# Patient Record
Sex: Male | Born: 1995 | Race: White | Hispanic: No | Marital: Single | Smoking: Never smoker
Health system: Southern US, Community
[De-identification: ages and names within clinical notes are randomized; demographics above are authoritative.]

---
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
## 2014-01-20 ENCOUNTER — Ambulatory Visit: Payer: Self-pay | Admitting: Cardiovascular Disease

## 2014-10-20 ENCOUNTER — Emergency Department: Payer: Self-pay | Admitting: Emergency Medicine

## 2014-10-20 LAB — CBC WITH DIFFERENTIAL/PLATELET
BASOS ABS: 0 10*3/uL (ref 0.0–0.1)
BASOS PCT: 0.3 %
EOS ABS: 0.1 10*3/uL (ref 0.0–0.7)
Eosinophil %: 0.9 %
HCT: 43.1 % (ref 40.0–52.0)
HGB: 14.1 g/dL (ref 13.0–18.0)
Lymphocyte #: 0.5 10*3/uL — ABNORMAL LOW (ref 1.0–3.6)
Lymphocyte %: 6.5 %
MCH: 28.8 pg (ref 26.0–34.0)
MCHC: 32.7 g/dL (ref 32.0–36.0)
MCV: 88 fL (ref 80–100)
MONO ABS: 0.5 x10 3/mm (ref 0.2–1.0)
Monocyte %: 6.6 %
NEUTROS PCT: 85.7 %
Neutrophil #: 6.1 10*3/uL (ref 1.4–6.5)
Platelet: 101 10*3/uL — ABNORMAL LOW (ref 150–440)
RBC: 4.9 10*6/uL (ref 4.40–5.90)
RDW: 13.7 % (ref 11.5–14.5)
WBC: 7.1 10*3/uL (ref 3.8–10.6)

## 2014-10-20 LAB — COMPREHENSIVE METABOLIC PANEL
ALT: 36 U/L (ref 14–63)
Albumin: 3.9 g/dL (ref 3.4–5.0)
Alkaline Phosphatase: 61 U/L (ref 46–116)
Anion Gap: 6 — ABNORMAL LOW (ref 7–16)
BUN: 17 mg/dL (ref 7–18)
Bilirubin,Total: 0.5 mg/dL (ref 0.2–1.0)
CHLORIDE: 104 mmol/L (ref 98–107)
CREATININE: 0.96 mg/dL (ref 0.60–1.30)
Calcium, Total: 8.4 mg/dL — ABNORMAL LOW (ref 8.5–10.1)
Co2: 29 mmol/L (ref 21–32)
EGFR (African American): >60
EGFR (Non-African Amer.): >60
Glucose: 100 mg/dL — ABNORMAL HIGH (ref 65–99)
OSMOLALITY: 279 (ref 275–301)
POTASSIUM: 3.9 mmol/L (ref 3.5–5.1)
SGOT(AST): 33 U/L (ref 15–37)
SODIUM: 139 mmol/L (ref 136–145)
TOTAL PROTEIN: 6.9 g/dL (ref 6.4–8.2)

## 2014-10-20 LAB — ED INFLUENZA
H1N1 flu by pcr: NOT DETECTED
INFLAPCR: NEGATIVE
INFLBPCR: NEGATIVE

## 2014-10-20 LAB — MONONUCLEOSIS SCREEN: Mono Test: NEGATIVE

## 2016-09-23 ENCOUNTER — Encounter: Payer: Self-pay | Admitting: Family Medicine

## 2016-09-23 ENCOUNTER — Encounter (INDEPENDENT_AMBULATORY_CARE_PROVIDER_SITE_OTHER): Payer: Self-pay

## 2016-09-23 ENCOUNTER — Ambulatory Visit (INDEPENDENT_AMBULATORY_CARE_PROVIDER_SITE_OTHER): Payer: 59 | Admitting: Family Medicine

## 2016-09-23 DIAGNOSIS — Z0000 Encounter for general adult medical examination without abnormal findings: Secondary | ICD-10-CM | POA: Insufficient documentation

## 2016-09-23 NOTE — Assessment & Plan Note (Signed)
Healthy young man. Preventative health care up to date. Declines labs today. Little alcohol. No tobacco.  Continue healthy diet and exercise.

## 2016-09-23 NOTE — Progress Notes (Signed)
Pre visit review using our clinic review tool, if applicable. No additional management support is needed unless otherwise documented below in the visit note. 

## 2016-09-23 NOTE — Patient Instructions (Signed)
Follow up annually.  See me sooner for acute issues.  Take care  Dr. Adriana Simasook   Health Maintenance, Male A healthy lifestyle and preventative care can promote health and wellness.  Maintain regular health, dental, and eye exams.  Eat a healthy diet. Foods like vegetables, fruits, whole grains, low-fat dairy products, and lean protein foods contain the nutrients you need and are low in calories. Decrease your intake of foods high in solid fats, added sugars, and salt. Get information about a proper diet from your health care provider, if necessary.  Regular physical exercise is one of the most important things you can do for your health. Most adults should get at least 150 minutes of moderate-intensity exercise (any activity that increases your heart rate and causes you to sweat) each week. In addition, most adults need muscle-strengthening exercises on 2 or more days a week.   Maintain a healthy weight. The body mass index (BMI) is a screening tool to identify possible weight problems. It provides an estimate of body fat based on height and weight. Your health care provider can find your BMI and can help you achieve or maintain a healthy weight. For males 20 years and older:  A BMI below 18.5 is considered underweight.  A BMI of 18.5 to 24.9 is normal.  A BMI of 25 to 29.9 is considered overweight.  A BMI of 30 and above is considered obese.  Maintain normal blood lipids and cholesterol by exercising and minimizing your intake of saturated fat. Eat a balanced diet with plenty of fruits and vegetables. Blood tests for lipids and cholesterol should begin at age 23 and be repeated every 5 years. If your lipid or cholesterol levels are high, you are over age 23, or you are at high risk for heart disease, you may need your cholesterol levels checked more frequently.Ongoing high lipid and cholesterol levels should be treated with medicines if diet and exercise are not working.  If you smoke,  find out from your health care provider how to quit. If you do not use tobacco, do not start.  Lung cancer screening is recommended for adults aged 55-80 years who are at high risk for developing lung cancer because of a history of smoking. A yearly low-dose CT scan of the lungs is recommended for people who have at least a 30-pack-year history of smoking and are current smokers or have quit within the past 15 years. A pack year of smoking is smoking an average of 1 pack of cigarettes a day for 1 year (for example, a 30-pack-year history of smoking could mean smoking 1 pack a day for 30 years or 2 packs a day for 15 years). Yearly screening should continue until the smoker has stopped smoking for at least 15 years. Yearly screening should be stopped for people who develop a health problem that would prevent them from having lung cancer treatment.  If you choose to drink alcohol, do not have more than 2 drinks per day. One drink is considered to be 12 oz (360 mL) of beer, 5 oz (150 mL) of wine, or 1.5 oz (45 mL) of liquor.  Avoid the use of street drugs. Do not share needles with anyone. Ask for help if you need support or instructions about stopping the use of drugs.  High blood pressure causes heart disease and increases the risk of stroke. High blood pressure is more likely to develop in:  People who have blood pressure in the end of the normal  range (100-139/85-89 mm Hg).  People who are overweight or obese.  People who are African American.  If you are 3718-23 years of age, have your blood pressure checked every 3-5 years. If you are 23 years of age or older, have your blood pressure checked every year. You should have your blood pressure measured twice-once when you are at a hospital or clinic, and once when you are not at a hospital or clinic. Record the average of the two measurements. To check your blood pressure when you are not at a hospital or clinic, you can use:  An automated blood  pressure machine at a pharmacy.  A home blood pressure monitor.  If you are 6545-23 years old, ask your health care provider if you should take aspirin to prevent heart disease.  Diabetes screening involves taking a blood sample to check your fasting blood sugar level. This should be done once every 3 years after age 23 if you are at a normal weight and without risk factors for diabetes. Testing should be considered at a younger age or be carried out more frequently if you are overweight and have at least 1 risk factor for diabetes.  Colorectal cancer can be detected and often prevented. Most routine colorectal cancer screening begins at the age of 23 and continues through age 23. However, your health care provider may recommend screening at an earlier age if you have risk factors for colon cancer. On a yearly basis, your health care provider may provide home test kits to check for hidden blood in the stool. A small camera at the end of a tube may be used to directly examine the colon (sigmoidoscopy or colonoscopy) to detect the earliest forms of colorectal cancer. Talk to your health care provider about this at age 23 when routine screening begins. A direct exam of the colon should be repeated every 5-10 years through age 23, unless early forms of precancerous polyps or small growths are found.  People who are at an increased risk for hepatitis B should be screened for this virus. You are considered at high risk for hepatitis B if:  You were born in a country where hepatitis B occurs often. Talk with your health care provider about which countries are considered high risk.  Your parents were born in a high-risk country and you have not received a shot to protect against hepatitis B (hepatitis B vaccine).  You have HIV or AIDS.  You use needles to inject street drugs.  You live with, or have sex with, someone who has hepatitis B.  You are a man who has sex with other men (MSM).  You get  hemodialysis treatment.  You take certain medicines for conditions like cancer, organ transplantation, and autoimmune conditions.  Hepatitis C blood testing is recommended for all people born from [birth year] through 1965 and any individual with known risk factors for hepatitis C.  Healthy men should no longer receive prostate-specific antigen (PSA) blood tests as part of routine cancer screening. Talk to your health care provider about prostate cancer screening.  Testicular cancer screening is not recommended for adolescents or adult males who have no symptoms. Screening includes self-exam, a health care provider exam, and other screening tests. Consult with your health care provider about any symptoms you have or any concerns you have about testicular cancer.  Practice safe sex. Use condoms and avoid high-risk sexual practices to reduce the spread of sexually transmitted infections (STIs).  You should be screened for STIs,  including gonorrhea and chlamydia if:  You are sexually active and are younger than 24 years.  You are older than 24 years, and your health care provider tells you that you are at risk for this type of infection.  Your sexual activity has changed since you were last screened, and you are at an increased risk for chlamydia or gonorrhea. Ask your health care provider if you are at risk.  If you are at risk of being infected with HIV, it is recommended that you take a prescription medicine daily to prevent HIV infection. This is called pre-exposure prophylaxis (PrEP). You are considered at risk if:  You are a man who has sex with other men (MSM).  You are a heterosexual man who is sexually active with multiple partners.  You take drugs by injection.  You are sexually active with a partner who has HIV.  Talk with your health care provider about whether you are at high risk of being infected with HIV. If you choose to begin PrEP, you should first be tested for HIV. You should  then be tested every 3 months for as long as you are taking PrEP.  Use sunscreen. Apply sunscreen liberally and repeatedly throughout the day. You should seek shade when your shadow is shorter than you. Protect yourself by wearing long sleeves, pants, a wide-brimmed hat, and sunglasses year round whenever you are outdoors.  Tell your health care provider of new moles or changes in moles, especially if there is a change in shape or color. Also, tell your health care provider if a mole is larger than the size of a pencil eraser.  A one-time screening for abdominal aortic aneurysm (AAA) and surgical repair of large AAAs by ultrasound is recommended for men aged 19-75 years who are current or former smokers.  Stay current with your vaccines (immunizations). This information is not intended to replace advice given to you by your health care provider. Make sure you discuss any questions you have with your health care provider. Document Released: 02/28/2008 Document Revised: 09/22/2014 Document Reviewed: 06/05/2015 Elsevier Interactive Patient Education  2017 [employer].

## 2016-09-23 NOTE — Progress Notes (Signed)
   Subjective:  Patient ID: Preston Nelson, male    DOB: 06/29/1996  Age: 21 y.o. MRN: [id number]  CC: Establish care, physical  HPI Preston Nelson is a 21 y.o. male presents to the clinic today to for a physical exam.  Preventative Healthcare  Immunizations  Tetanus - Up to date.   Flu - Up to date.   Labs: Declines labs today.  Exercise: Exercises regularly.   Alcohol use: See below.  Smoking/tobacco use: Nonsmoker.  STD/HIV testing: Has had HIV screening.  Wears seat belt: Yes.   PMH, Surgical Hx, Family Hx, Social History reviewed and updated as below.  No PMH per patient.   Past Surgical History:  Procedure Laterality Date  . TONSILLECTOMY  2011  . WISDOM TOOTH EXTRACTION  2012   Family History  Problem Relation Age of Onset  . Prostate cancer Maternal Uncle   . Mental illness Paternal Uncle     bipolar  . Kidney disease Maternal Grandfather   . Alcohol abuse Paternal Grandmother    Social History  Substance Use Topics  . Smoking status: Never Smoker  . Smokeless tobacco: Never Used  . Alcohol use 0.0 - 0.6 oz/week   Review of Systems General: Denies unexplained weight loss, fever. Skin: Denies new or changing mole, sore/wound that won't heal. ENT: Trouble hearing, ringing in the ears, sores in the mouth, hoarseness, trouble swallowing. Eyes: Denies trouble seeing/visual disturbance. Heart/CV: Denies chest pain, shortness of breath, edema, palpitations. Lungs/Resp: Denies cough, shortness of breath, hemoptysis. Abd/GI: Denies nausea, vomiting, diarrhea, constipation, abdominal pain, hematochezia, melena. GU: Denies dysuria, incontinence, hematuria, urinary frequency, difficulty starting/keeping stream, penile discharge, sexual difficulty, lump in testicles. MSK: Denies joint pain/swelling, myalgias. Neuro: Denies headaches, weakness, numbness, dizziness, syncope. Psych: Denies sadness, anxiety, stress, memory difficulty. Endocrine: Denies polyuria and  polydipsia.  Objective:   Today's Vitals: BP 110/70   Pulse (!) 48   Temp 97.5 F (36.4 C) (Oral)   Resp 12   Ht 6' 1.5" (1.867 m) Comment: with shoes  Wt 232 lb 9.6 oz (105.5 kg)   SpO2 99%   BMI 30.27 kg/m   Physical Exam  Constitutional: He is oriented to person, place, and time. He appears well-developed and well-nourished. No distress.  HENT:  Head: Normocephalic and atraumatic.  Nose: Nose normal.  Mouth/Throat: Oropharynx is clear and moist. No oropharyngeal exudate.  Normal TM's bilaterally.   Eyes: Conjunctivae are normal. No scleral icterus.  Neck: Neck supple.  Cardiovascular: Regular rhythm.  Bradycardia present.   No murmur heard. Pulmonary/Chest: Effort normal and breath sounds normal. He has no wheezes. He has no rales.  Abdominal: Soft. He exhibits no distension. There is no tenderness. There is no rebound and no guarding.  Musculoskeletal: Normal range of motion. He exhibits no edema.  Lymphadenopathy:    He has no cervical adenopathy.  Neurological: He is alert and oriented to person, place, and time.  Skin: Skin is warm and dry. No rash noted.  Psychiatric: He has a normal mood and affect.  Vitals reviewed.  Assessment & Plan:   Problem List Items Addressed This Visit    Annual physical exam    Healthy young man. Preventative health care up to date. Declines labs today. Little alcohol. No tobacco.  Continue healthy diet and exercise.        Follow-up: Annually   Everlene OtherJayce Mearl Olver DO [facility] [place]

## 2016-12-26 DIAGNOSIS — J019 Acute sinusitis, unspecified: Secondary | ICD-10-CM | POA: Diagnosis not present

## 2016-12-26 DIAGNOSIS — J301 Allergic rhinitis due to pollen: Secondary | ICD-10-CM | POA: Diagnosis not present

## 2016-12-26 DIAGNOSIS — B9689 Other specified bacterial agents as the cause of diseases classified elsewhere: Secondary | ICD-10-CM | POA: Diagnosis not present

## 2017-05-01 ENCOUNTER — Ambulatory Visit (INDEPENDENT_AMBULATORY_CARE_PROVIDER_SITE_OTHER): Payer: 59 | Admitting: Family Medicine

## 2017-05-01 ENCOUNTER — Encounter: Payer: Self-pay | Admitting: Family Medicine

## 2017-05-01 DIAGNOSIS — F988 Other specified behavioral and emotional disorders with onset usually occurring in childhood and adolescence: Secondary | ICD-10-CM

## 2017-05-01 MED ORDER — LISDEXAMFETAMINE DIMESYLATE 30 MG PO CAPS: 30.0000 mg | ORAL_CAPSULE | Freq: Every day | ORAL | 0 refills | Status: DC

## 2017-05-01 NOTE — Patient Instructions (Signed)
Take the medication as prescribed.  Call or send me a message and let me know how its working.  Take care  Dr. Adriana Simas

## 2017-05-04 DIAGNOSIS — F988 Other specified behavioral and emotional disorders with onset usually occurring in childhood and adolescence: Secondary | ICD-10-CM | POA: Insufficient documentation

## 2017-05-04 NOTE — Assessment & Plan Note (Signed)
New problem (to me). Will need records. Starting Vyvanse.

## 2017-05-04 NOTE — Progress Notes (Signed)
   Subjective:  Patient ID: Preston Nelson, male    DOB: [birth year]  Age: 21 y.o. MRN: [id number]  CC: ADD  HPI:  21 year old male presents with the above complaints.  Patient did not inform me of a prior history of attention deficit disorder at his prior visit. He states that he was diagnosed as a young child. He used several medications while in school. He states that he also saw psychologist in [place]. No records available. He previously saw [facility] pediatrics. He states that the only thing that helped his attention deficit was Vyvanse. He states that he is now in school and is struggling with focusing as well as anxiety that accompanies this. This is been worse over the past 1-2 months. He would like to discuss a trial of Vyvanse.  Social Hx   Social History   Social History  . Marital status: Single    Spouse name: N/A  . Number of children: N/A  . Years of education: N/A   Social History Main Topics  . Smoking status: Never Smoker  . Smokeless tobacco: Never Used  . Alcohol use 0.0 - 0.6 oz/week  . Drug use: No  . Sexual activity: Yes    Partners: Female   Other Topics Concern  . None   Social History Narrative  . None    Review of Systems  Constitutional: Negative.   Psychiatric/Behavioral: Positive for decreased concentration. The patient is nervous/anxious.    Objective:  BP 110/68 (BP Location: Left Arm, Patient Position: Sitting, Cuff Size: Normal)   Pulse 61   Temp 98.7 F (37.1 C) (Oral)   Resp 16   Wt 240 lb (108.9 kg)   SpO2 96%   BMI 31.23 kg/m   BP/Weight 05/01/2017 09/23/2016  Systolic BP 110 110  Diastolic BP 68 70  Wt. (Lbs) 240 232.6  BMI 31.23 30.27    Physical Exam  Constitutional: He is oriented to person, place, and time. He appears well-developed. No distress.  Cardiovascular: Normal rate and regular rhythm.   No murmur heard. Pulmonary/Chest: Effort normal. He has no wheezes. He has no rales.  Neurological: He is alert and  oriented to person, place, and time.  Psychiatric: He has a normal mood and affect.  Vitals reviewed.   Lab Results  Component Value Date   WBC 7.1 10/20/2014   HGB 14.1 10/20/2014   HCT 43.1 10/20/2014   PLT 101 (L) 10/20/2014   GLUCOSE 100 (H) 10/20/2014   ALT 36 10/20/2014   AST 33 10/20/2014   NA 139 10/20/2014   K 3.9 10/20/2014   CL 104 10/20/2014   CREATININE 0.96 10/20/2014   BUN 17 10/20/2014   CO2 29 10/20/2014    Assessment & Plan:   Problem List Items Addressed This Visit    ADD (attention deficit disorder)    New problem (to me). Will need records. Starting Vyvanse.         Meds ordered this encounter  Medications  . lisdexamfetamine (VYVANSE) 30 MG capsule    Sig: Take 1 capsule (30 mg total) by mouth daily.    Dispense:  30 capsule    Refill:  0     Follow-up: PRN  Everlene Other DO [facility]

## 2017-05-05 ENCOUNTER — Telehealth: Payer: Self-pay | Admitting: *Deleted

## 2017-05-05 NOTE — Telephone Encounter (Signed)
Pt requested a call to discuss his Rx for Vyvanse . Pt insuranse denied the script and requested that he tried Adderall first  . Pt stated that in the pass adderall did not work for him, due to side effects. Pt stated this medication may need a prior authorization to approve Vyvanse  Pt [phone]

## 2017-05-05 NOTE — Telephone Encounter (Signed)
Patient states he is unable to tolerate Adderall due to mood swings, loss of appetite, unable to sleep.   Was diagnosed when 10 years.

## 2017-05-06 NOTE — Telephone Encounter (Signed)
Prior authorization submitted to [employer].

## 2017-05-06 NOTE — Telephone Encounter (Signed)
Pt called back looking for an update on this. Pt states that he does not want tot take the adderall because he had negative effects previosuly. Please advise, thank you!

## 2017-05-11 NOTE — Telephone Encounter (Signed)
Prior authorization approved for a maximum of 12 fillls 05/08/17-05/07/18

## 2017-06-15 ENCOUNTER — Encounter: Payer: Self-pay | Admitting: Family

## 2017-06-15 ENCOUNTER — Ambulatory Visit (INDEPENDENT_AMBULATORY_CARE_PROVIDER_SITE_OTHER): Payer: 59 | Admitting: Family

## 2017-06-15 VITALS — BP 120/78 | HR 59 | Temp 98.7°F | Ht 73.5 in | Wt 234.2 lb

## 2017-06-15 DIAGNOSIS — R0981 Nasal congestion: Secondary | ICD-10-CM

## 2017-06-15 DIAGNOSIS — F988 Other specified behavioral and emotional disorders with onset usually occurring in childhood and adolescence: Secondary | ICD-10-CM | POA: Diagnosis not present

## 2017-06-15 MED ORDER — LISDEXAMFETAMINE DIMESYLATE 30 MG PO CAPS: 30.0000 mg | ORAL_CAPSULE | Freq: Every day | ORAL | 0 refills | Status: DC

## 2017-06-15 NOTE — Progress Notes (Signed)
Subjective:    Patient ID: Preston Nelson, male    DOB: 07/04/1996, 21 y.o.   MRN: [id number]  CC: Preston Nelson is a 21 y.o. male who presents today for follow up.   HPI: ADHD-  Would like refill of vyvanse. Diagnosed when 21 years old in [place]. Stopped medication at 21 years old. 'didn't think needed it anymore.' Thought a month ago , wanted to try it again when started full time school and work. Trouble multi tasking.  Thinks vyvanse helps with anxiety as feels overwhelmed.   No depression, anxiety.   No cardiac history. No CP, trouble sleeping, excess sweatiness, weight changes.    Runs regularly.   No drug use or smoking. Occasional alcohol.  Complains of nasal congestion x 2 days, worsening.  Ear pressure in right ear, sinus pressure on right side.  Hasnt tried any medications.  No cough, fever.  Thinks has seasonal allergies.         HISTORY:  No past medical history on file. Past Surgical History:  Procedure Laterality Date  . TONSILLECTOMY  2011  . WISDOM TOOTH EXTRACTION  2012   Family History  Problem Relation Age of Onset  . Prostate cancer Maternal Uncle   . Mental illness Paternal Uncle        bipolar  . Kidney disease Maternal Grandfather   . Alcohol abuse Paternal Grandmother     Allergies: Patient has no known allergies. No current outpatient prescriptions on file prior to visit.   No current facility-administered medications on file prior to visit.     Social History  Substance Use Topics  . Smoking status: Never Smoker  . Smokeless tobacco: Never Used  . Alcohol use 0.0 - 0.6 oz/week    Review of Systems  Constitutional: Negative for chills and fever.  HENT: Positive for congestion, ear pain and sinus pressure. Negative for sore throat.   Respiratory: Negative for cough, shortness of breath and wheezing.   Cardiovascular: Negative for chest pain and palpitations.  Gastrointestinal: Negative for nausea and vomiting.    Psychiatric/Behavioral: Negative for decreased concentration.      Objective:    BP 120/78   Pulse (!) 59   Temp 98.7 F (37.1 C) (Oral)   Ht 6' 1.5" (1.867 m)   Wt 234 lb 3.2 oz (106.2 kg)   SpO2 98%   BMI 30.48 kg/m  BP Readings from Last 3 Encounters:  06/15/17 120/78  05/01/17 110/68  09/23/16 110/70   Wt Readings from Last 3 Encounters:  06/15/17 234 lb 3.2 oz (106.2 kg)  05/01/17 240 lb (108.9 kg)  09/23/16 232 lb 9.6 oz (105.5 kg)    Physical Exam  Constitutional: Vital signs are normal. He appears well-developed and well-nourished.  HENT:  Head: Normocephalic and atraumatic.  Right Ear: Hearing, tympanic membrane, external ear and ear canal normal. No drainage, swelling or tenderness. Tympanic membrane is not injected, not erythematous and not bulging. No middle ear effusion. No decreased hearing is noted.  Left Ear: Hearing, tympanic membrane, external ear and ear canal normal. No drainage, swelling or tenderness. Tympanic membrane is not injected, not erythematous and not bulging.  No middle ear effusion. No decreased hearing is noted.  Nose: Nose normal. Right sinus exhibits no maxillary sinus tenderness and no frontal sinus tenderness. Left sinus exhibits no maxillary sinus tenderness and no frontal sinus tenderness.  Mouth/Throat: Uvula is midline, oropharynx is clear and moist and mucous membranes are normal. No oropharyngeal exudate, posterior  oropharyngeal edema, posterior oropharyngeal erythema or tonsillar abscesses.  Eyes: Conjunctivae are normal.  Cardiovascular: Regular rhythm and normal heart sounds.   Pulmonary/Chest: Effort normal and breath sounds normal. No respiratory distress. He has no wheezes. He has no rhonchi. He has no rales.  Lymphadenopathy:       Head (right side): No submental, no submandibular, no tonsillar, no preauricular, no posterior auricular and no occipital adenopathy present.       Head (left side): No submental, no submandibular,  no tonsillar, no preauricular, no posterior auricular and no occipital adenopathy present.    He has no cervical adenopathy.  Neurological: He is alert.  Skin: Skin is warm and dry.  Psychiatric: He has a normal mood and affect. His speech is normal and behavior is normal.  Vitals reviewed.      Assessment & Plan:   Problem List Items Addressed This Visit      Respiratory   Sinus congestion    Working diagnosis of viral URI x 2 days. No acute respiratory distress. Afebrile. Patient and I agreed upon conservative therapy at this time with symptom management, close observation, and delayed antibiotic treatment.         Other   ADD (attention deficit disorder) - Primary    No records since testing down at 21 yo. At this point, agreed to continue treating patient and trust that he had formal testing. New CSC.  I looked up patient on [place] Controlled Substances Reporting System and saw no activity that raised concern of inappropriate use.        Relevant Medications   lisdexamfetamine (VYVANSE) 30 MG capsule       I am having Preston Nelson maintain his lisdexamfetamine.   Meds ordered this encounter  Medications  . DISCONTD: lisdexamfetamine (VYVANSE) 30 MG capsule    Sig: Take 1 capsule (30 mg total) by mouth daily.    Dispense:  30 capsule    Refill:  0    Order Specific Question:   Supervising Provider    Answer:   Duncan Dull L [[id number]]  . DISCONTD: lisdexamfetamine (VYVANSE) 30 MG capsule    Sig: Take 1 capsule (30 mg total) by mouth daily.    Dispense:  30 capsule    Refill:  0    Do not fill until 07/16/2017    Order Specific Question:   Supervising Provider    Answer:   Duncan Dull L [[id number]]  . lisdexamfetamine (VYVANSE) 30 MG capsule    Sig: Take 1 capsule (30 mg total) by mouth daily.    Dispense:  30 capsule    Refill:  0    Do not fill until 08/15/2017    Order Specific Question:   Supervising Provider    Answer:   Sherlene Shams [[id number]]    Return  precautions given.   Risks, benefits, and alternatives of the medications and treatment plan prescribed today were discussed, and patient expressed understanding.   Education regarding symptom management and diagnosis given to patient on AVS.  Continue to follow with Glori Luis, MD for routine health maintenance.   Crist Fat and I agreed with plan.   Rennie Plowman, FNP

## 2017-06-15 NOTE — Patient Instructions (Signed)
Continue vyvanse; let me know if any side affects  Follow up 3 months with Dr Birdie Sons  Suspect viral congestion  Mucinex as discussed  Let me know if not better

## 2017-06-15 NOTE — Progress Notes (Signed)
Pre visit review using our clinic review tool, if applicable. No additional management support is needed unless otherwise documented below in the visit note. 

## 2017-06-16 DIAGNOSIS — F411 Generalized anxiety disorder: Secondary | ICD-10-CM | POA: Diagnosis not present

## 2017-06-16 DIAGNOSIS — R0981 Nasal congestion: Secondary | ICD-10-CM | POA: Insufficient documentation

## 2017-06-16 NOTE — Assessment & Plan Note (Addendum)
Stable on current regimen. No records since testing down at 21 yo. At this point, agreed to continue treating patient and trust that he had formal testing. New CSC.  I looked up patient on [place] Controlled Substances Reporting System and saw no activity that raised concern of inappropriate use.

## 2017-06-16 NOTE — Assessment & Plan Note (Signed)
Working diagnosis of viral URI x 2 days. No acute respiratory distress. Afebrile. Patient and I agreed upon conservative therapy at this time with symptom management, close observation, and delayed antibiotic treatment.

## 2017-06-17 DIAGNOSIS — F411 Generalized anxiety disorder: Secondary | ICD-10-CM | POA: Diagnosis not present

## 2017-06-23 DIAGNOSIS — F411 Generalized anxiety disorder: Secondary | ICD-10-CM | POA: Diagnosis not present

## 2017-07-01 ENCOUNTER — Ambulatory Visit: Payer: 59 | Admitting: Family Medicine

## 2017-07-03 ENCOUNTER — Emergency Department: Payer: 59

## 2017-07-03 ENCOUNTER — Emergency Department
Admission: EM | Admit: 2017-07-03 | Discharge: 2017-07-03 | Disposition: A | Discharge status: Home / Self Care | Payer: 59 | Attending: Student in an Organized Health Care Education/Training Program | Admitting: Student in an Organized Health Care Education/Training Program

## 2017-07-03 ENCOUNTER — Encounter: Payer: Self-pay | Admitting: Emergency Medicine

## 2017-07-03 DIAGNOSIS — Z79899 Other long term (current) drug therapy: Secondary | ICD-10-CM | POA: Diagnosis not present

## 2017-07-03 DIAGNOSIS — Y929 Unspecified place or not applicable: Secondary | ICD-10-CM | POA: Insufficient documentation

## 2017-07-03 DIAGNOSIS — W228XXA Striking against or struck by other objects, initial encounter: Secondary | ICD-10-CM | POA: Insufficient documentation

## 2017-07-03 DIAGNOSIS — Y999 Unspecified external cause status: Secondary | ICD-10-CM | POA: Insufficient documentation

## 2017-07-03 DIAGNOSIS — S61239A Puncture wound without foreign body of unspecified finger without damage to nail, initial encounter: Secondary | ICD-10-CM

## 2017-07-03 DIAGNOSIS — Y9389 Activity, other specified: Secondary | ICD-10-CM | POA: Insufficient documentation

## 2017-07-03 DIAGNOSIS — S61231A Puncture wound without foreign body of left index finger without damage to nail, initial encounter: Secondary | ICD-10-CM | POA: Insufficient documentation

## 2017-07-03 DIAGNOSIS — S6992XA Unspecified injury of left wrist, hand and finger(s), initial encounter: Secondary | ICD-10-CM | POA: Diagnosis not present

## 2017-07-03 MED ORDER — BACITRACIN ZINC 500 UNIT/GM EX OINT
TOPICAL_OINTMENT | CUTANEOUS | Status: AC
  Administered 2017-07-03: 1 via TOPICAL
  Filled 2017-07-03: qty 0.9

## 2017-07-03 MED ORDER — LIDOCAINE HCL (PF) 1 % IJ SOLN
5.0000 mL | Freq: Once | INTRAMUSCULAR | Status: AC
  Administered 2017-07-03: 5 mL
  Filled 2017-07-03: qty 5

## 2017-07-03 MED ORDER — SULFAMETHOXAZOLE-TRIMETHOPRIM 800-160 MG PO TABS: 1.0000 | ORAL_TABLET | Freq: Two times a day (BID) | ORAL | 0 refills | Status: DC

## 2017-07-03 MED ORDER — SULFAMETHOXAZOLE-TRIMETHOPRIM 800-160 MG PO TABS
1.0000 | ORAL_TABLET | Freq: Once | ORAL | Status: AC
  Administered 2017-07-03: 1 via ORAL
  Filled 2017-07-03: qty 1

## 2017-07-03 MED ORDER — BACITRACIN ZINC 500 UNIT/GM EX OINT
TOPICAL_OINTMENT | Freq: Two times a day (BID) | CUTANEOUS | Status: DC
  Administered 2017-07-03: 1 via TOPICAL

## 2017-07-03 MED ORDER — IBUPROFEN 800 MG PO TABS
800.0000 mg | ORAL_TABLET | Freq: Once | ORAL | Status: AC
  Administered 2017-07-03: 800 mg via ORAL
  Filled 2017-07-03: qty 1

## 2017-07-03 MED ORDER — HYDROCODONE-ACETAMINOPHEN 5-325 MG PO TABS: 1.0000 | ORAL_TABLET | Freq: Three times a day (TID) | ORAL | 0 refills | Status: DC | PRN

## 2017-07-03 NOTE — ED Notes (Signed)
Drilled left index finger at school this AM.  Bandaid in place, no active bleeding.

## 2017-07-03 NOTE — ED Triage Notes (Signed)
Pt states was drilling and drilled into his L index finger. Pt presents with wound to distal pad of  L anterior index finger. No bleeding noted at this time. Pt states he thinks he is up to date on tetanus shot.

## 2017-07-03 NOTE — ED Provider Notes (Signed)
[facility] Emergency Department Provider Note ____________________________________________  Time seen: 1147  I have reviewed the triage vital signs and the nursing notes.  HISTORY  Chief Complaint  Finger Injury  HPI Preston Nelson is a 21 y.o. male Presents to the ED for evaluation of an injury to his left next finger. Patient was at work today, when he actively 21-year-old through a piece of metal, into his left index finger fat pad. He was not wearing gloves at the time. He describes some minimal bleeding and pain to the fingertip. He reports his tetanus is up-to-date as of 2016. He presents now with bleeding controlled to the left index finger fat pad with local swelling and ecchymosis.  History reviewed. No pertinent past medical history.  Patient Active Problem List   Diagnosis Date Noted  . Sinus congestion 06/16/2017  . ADD (attention deficit disorder) 05/04/2017  . Annual physical exam 09/23/2016    Past Surgical History:  Procedure Laterality Date  . TONSILLECTOMY  2011  . WISDOM TOOTH EXTRACTION  2012    Prior to Admission medications   Medication Sig Start Date End Date Taking? Authorizing Provider  HYDROcodone-acetaminophen (NORCO) 5-325 MG tablet Take 1 tablet by mouth 3 (three) times daily as needed. 07/03/17   Pebbles Zeiders, Charlesetta IvoryJenise V Bacon, PA-C  lisdexamfetamine (VYVANSE) 30 MG capsule Take 1 capsule (30 mg total) by mouth daily. 06/15/17   Allegra GranaArnett, Margaret G, FNP  sulfamethoxazole-trimethoprim (BACTRIM DS,SEPTRA DS) 800-160 MG tablet Take 1 tablet by mouth 2 (two) times daily. 07/03/17   Samaira Holzworth, Charlesetta IvoryJenise V Bacon, PA-C    Allergies Patient has no known allergies.  Family History  Problem Relation Age of Onset  . Prostate cancer Maternal Uncle   . Mental illness Paternal Uncle        bipolar  . Kidney disease Maternal Grandfather   . Alcohol abuse Paternal Grandmother     Social History Social History  Substance Use Topics  . Smoking  status: Never Smoker  . Smokeless tobacco: Never Used  . Alcohol use 0.0 - 0.6 oz/week    Review of Systems  Constitutional: Negative for fever. Cardiovascular: Negative for chest pain. Respiratory: Negative for shortness of breath. Musculoskeletal: Negative for back pain. Skin: Negative for rash. Left index finger puncture wound as above Neurological: Negative for headaches, focal weakness or numbness. ____________________________________________  PHYSICAL EXAM:  VITAL SIGNS: ED Triage Vitals  Enc Vitals Group     BP 07/03/17 1119 127/70     Pulse Rate 07/03/17 1119 (!) 53     Resp 07/03/17 1119 18     Temp 07/03/17 1119 98.1 F (36.7 C)     Temp Source 07/03/17 1119 Oral     SpO2 07/03/17 1119 100 %     Weight 07/03/17 1120 224 lb (101.6 kg)     Height 07/03/17 1120 6\' 2"  (1.88 m)     Head Circumference --      Peak Flow --      Pain Score 07/03/17 1119 4     Pain Loc --      Pain Edu? --      Excl. in GC? --     Constitutional: Alert and oriented. Well appearing and in no distress. Head: Normocephalic and atraumatic. Cardiovascular: Normal rate, regular rhythm. Normal distal pulses. Respiratory: Normal respiratory effort. No wheezes/rales/rhonchi. Musculoskeletal: normal composite fist of the left hand. Normal flexion and extension of the left index finger DIP. Patient with a single puncture wound to the  middle aspect of the fat pad. There is local ecchymosis and swelling to the distal left index finger fat pad. Nontender with normal range of motion in all extremities.  Neurologic:  Normal sensation. Normal speech and language. No gross focal neurologic deficits are appreciated. Skin:  Skin is warm, dry and intact. No rash noted. ____________________________________________   RADIOLOGY  Left Index Finger  IMPRESSION: Evidence of old injury to the distal aspect of the second distal phalanx. No acute fracture or dislocation. No radiopaque foreign body. No  apparent arthropathy.  I, Jakeel Starliper, Charlesetta Ivory, personally viewed and evaluated these images (plain radiographs) as part of my medical decision making, as well as reviewing the written report by the radiologist. ____________________________________________  PROCEDURES  IBU 800 mg PO Bactrim DS 1 PO  NERVE BLOCK Performed by: Lissa Hoard Consent: Verbal consent obtained. Required items: required blood products, implants, devices, and special equipment available Time out: Immediately prior to procedure a "time out" was called to verify the correct patient, procedure, equipment, support staff and site/side marked as required.  Indication: pain  Nerve block body site: left index finger flexor tendon sheath   Preparation: Patient was prepped and draped in the usual sterile fashion. Needle gauge: 24 G Location technique: anatomical landmarks  Local anesthetic: 1% lido w/o epi  Anesthetic total: 3 ml  Outcome: pain improved Patient tolerance: Patient tolerated the procedure well with no immediate complications. ____________________________________________  INITIAL IMPRESSION / ASSESSMENT AND PLAN / ED COURSE  Patient presents to the ED for evaluation of a left index finger puncture wound. His x-ray is negative for any retained foreign body or bony injury. He is reassured by that finding. He is offer a digital block for his acutely tender pulp trauma. He is discharged with wound care instructions and a prescription for Bactrim and Norco (#10). He will follow-up with his provider or return as discussed. ____________________________________________  FINAL CLINICAL IMPRESSION(S) / ED DIAGNOSES  Final diagnoses:  Puncture wound of finger of left hand, initial encounter      Lissa Hoard, PA-C 07/03/17 1640    Willy Eddy, MD 07/05/17 [id number]

## 2017-07-03 NOTE — Discharge Instructions (Signed)
Your exam and x-ray are consistent with soft tissue injury to the fat pad of the index finger. Take the antibiotic as directed, and the pain medicine as needed. Keep the wound clean, dry, and covered. Wear the finger splint for comfort. Return to the ED for signs of worsening infection.

## 2017-07-07 DIAGNOSIS — F411 Generalized anxiety disorder: Secondary | ICD-10-CM | POA: Diagnosis not present

## 2017-07-14 DIAGNOSIS — F411 Generalized anxiety disorder: Secondary | ICD-10-CM | POA: Diagnosis not present

## 2017-08-13 ENCOUNTER — Ambulatory Visit: Payer: Self-pay | Admitting: *Deleted

## 2017-08-13 DIAGNOSIS — R102 Pelvic and perineal pain: Secondary | ICD-10-CM | POA: Diagnosis not present

## 2017-08-13 DIAGNOSIS — R1033 Periumbilical pain: Secondary | ICD-10-CM | POA: Diagnosis not present

## 2017-08-13 DIAGNOSIS — R300 Dysuria: Secondary | ICD-10-CM | POA: Diagnosis not present

## 2017-08-13 NOTE — Telephone Encounter (Signed)
FYI

## 2017-08-13 NOTE — Telephone Encounter (Signed)
Pt called with complaints of sharp pulling pain in belly button; pt said stopped after about after 5 hours; previously started about a month ago but pt opted not to go to MD; Pt states that it is hard to ignore pain now and that he has not urinated due to fear of increased pain ; pt  prefers to be seen in MD's office; pt advised per nurse triage protocol he should go to ED for evaluation; pt verbalizes understanding and will proceed to ED; [facility] of events.   Reason for Disposition . [1] Unable to urinate (or only a few drops) > 4 hours AND     [2] bladder feels very full (e.g., palpable bladder or strong urge to urinate)  Answer Assessment - Initial Assessment Questions 1. LOCATION: "Where does it hurt?"      At belly button' internal 2. RADIATION: "Does the pain shoot anywhere else?" (e.g., chest, back)     no 3. ONSET: "When did the pain begin?" (Minutes, hours or days ago)      Initially 4 weeks ago but went away; came back today 4. SUDDEN: "Gradual or sudden onset?"     sudden 5. PATTERN "Does the pain come and go, or is it constant?"    - If constant: "Is it getting better, staying the same, or worsening?"      (Note: Constant means the pain never goes away completely; most serious pain is constant and it progresses)     - If intermittent: "How long does it last?" "Do you have pain now?"     (Note: Intermittent means the pain goes away completely between bouts)    Constant but hurts worse when moving, coughing, or stretching 6. SEVERITY: "How bad is the pain?"  (e.g., Scale 1-10; mild, moderate, or severe)    - MILD (1-3): doesn't interfere with normal activities, abdomen soft and not tender to touch     - MODERATE (4-7): interferes with normal activities or awakens from sleep, tender to touch     - SEVERE (8-10): excruciating pain, doubled over, unable to do any normal activities       Rated 4-5 out of 10 7. RECURRENT SYMPTOM: "Have you ever had  this type of abdominal pain before?" If so, ask: "When was the last time?" and "What happened that time?"      1 month ago 8. CAUSE: "What do you think is causing the abdominal pain?"     hernia 9. RELIEVING/AGGRAVATING FACTORS: "What makes it better or worse?" (e.g., movement, antacids, bowel movement)     movement 10. OTHER SYMPTOMS: "Has there been any vomiting, diarrhea, constipation, or urine problems?"       no  Protocols used: ABDOMINAL PAIN - MALE-A-AH

## 2017-08-13 NOTE — Telephone Encounter (Signed)
Please follow up with the patient to ensure he gets evaluated. Thanks.

## 2017-08-13 NOTE — Telephone Encounter (Signed)
Attempted to reach patient, mailbox is full.

## 2017-08-13 NOTE — Telephone Encounter (Signed)
Noted  

## 2017-08-14 NOTE — Telephone Encounter (Signed)
Patient states he is not having as much pain, he went to [facility] walk in and was evaluated. He was given an antibiotics for prostatitis.

## 2017-08-14 NOTE — Telephone Encounter (Signed)
Please follow-up with patient regarding this. Thanks.

## 2017-08-14 NOTE — Telephone Encounter (Signed)
Noted. Thanks.

## 2017-09-28 ENCOUNTER — Ambulatory Visit: Payer: 59 | Admitting: Family Medicine

## 2017-10-19 DIAGNOSIS — J019 Acute sinusitis, unspecified: Secondary | ICD-10-CM | POA: Diagnosis not present

## 2017-11-16 ENCOUNTER — Telehealth: Payer: Self-pay | Admitting: Family Medicine

## 2017-11-16 NOTE — Telephone Encounter (Signed)
Tried calling, ok for pec to speak with patient and ask why he needs referral, depression? Does patient have any thought of harming himself or others? Any thoughts of suicide? Please get more details

## 2017-11-16 NOTE — Telephone Encounter (Signed)
Copied from CRM [phone]#[id number]. Topic: Quick Communication - See Telephone Encounter >> Nov 16, 2017  3:54 PM Cipriano BunkerLambe, Annette S wrote: CRM for notification. See Telephone encounter for:   Patient called about referral to a psychiatrist.    Set appt up for 3/29, but he did not want to wait this long, asking if can get referral now and not sure if needs to see Dr. Birdie SonsSonnenberg  11/16/17.

## 2017-11-25 NOTE — Telephone Encounter (Signed)
Patient did not return call.

## 2017-12-11 ENCOUNTER — Ambulatory Visit: Payer: 59 | Admitting: Family Medicine

## 2017-12-11 DIAGNOSIS — Z0289 Encounter for other administrative examinations: Secondary | ICD-10-CM

## 2018-04-10 IMAGING — DX DG FINGER INDEX 2+V*L*
3 series · 3 of 3 positions shown · non-contrast
Comparison: None.

CLINICAL DATA: Injury with drill

EXAM:
LEFT SECOND FINGER 2+V

[finger ap]
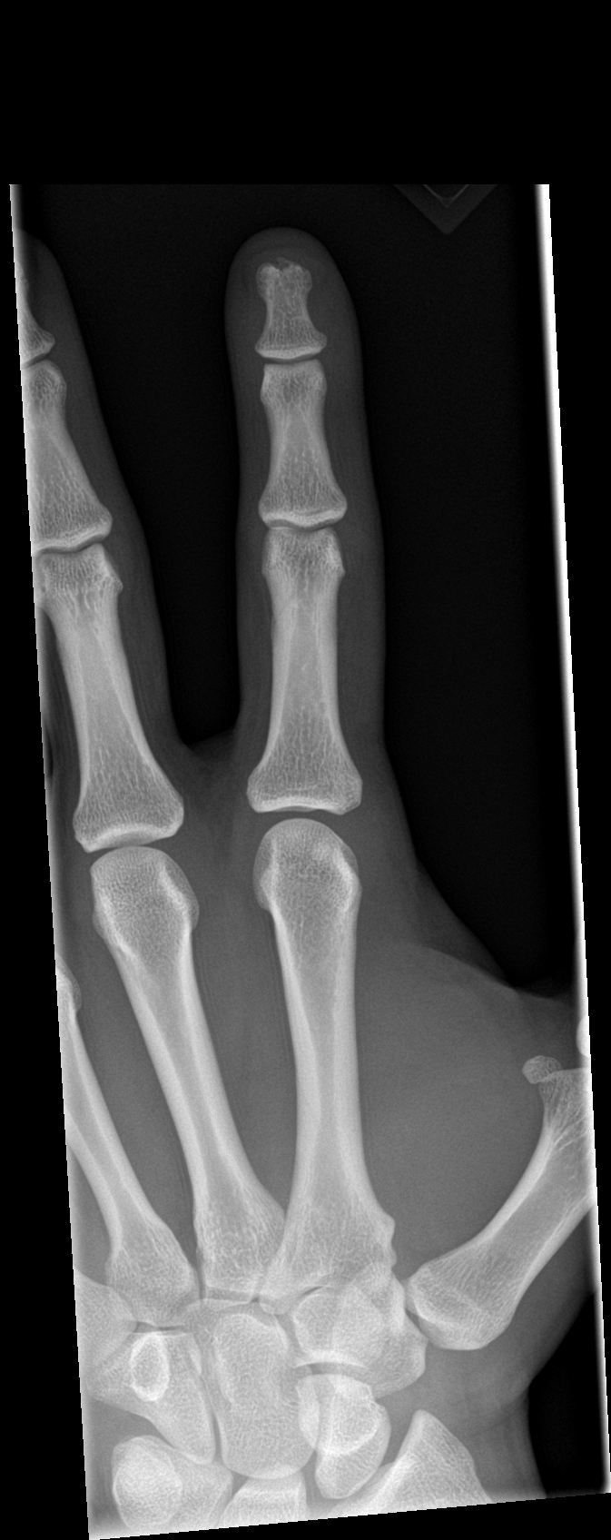

[finger obl]
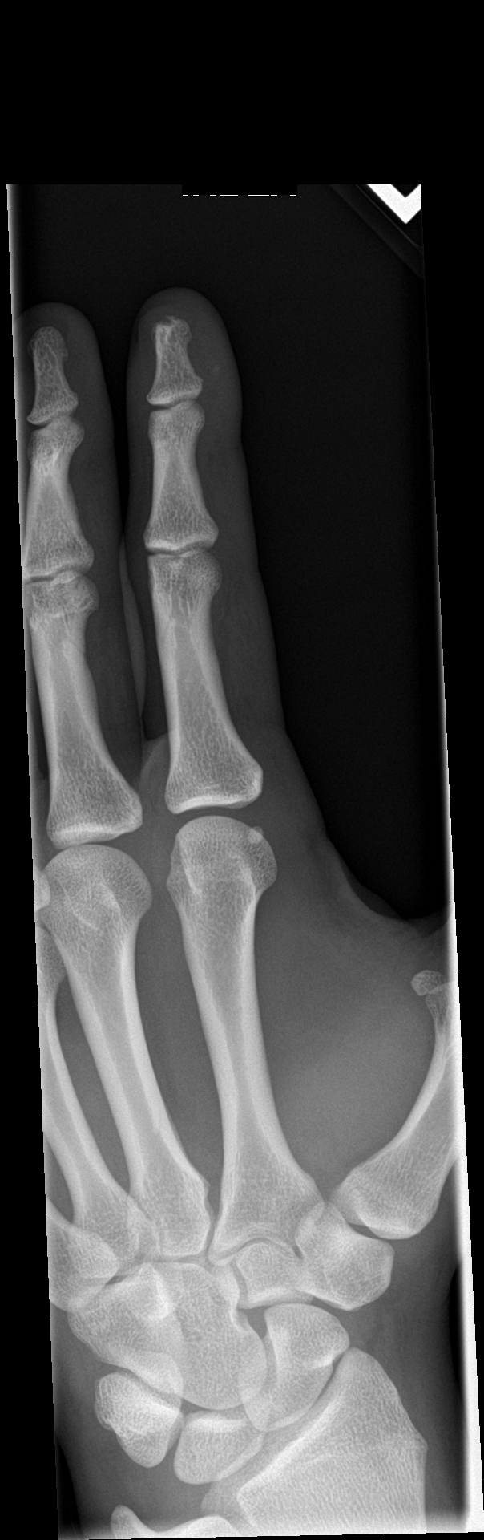

[finger lat]
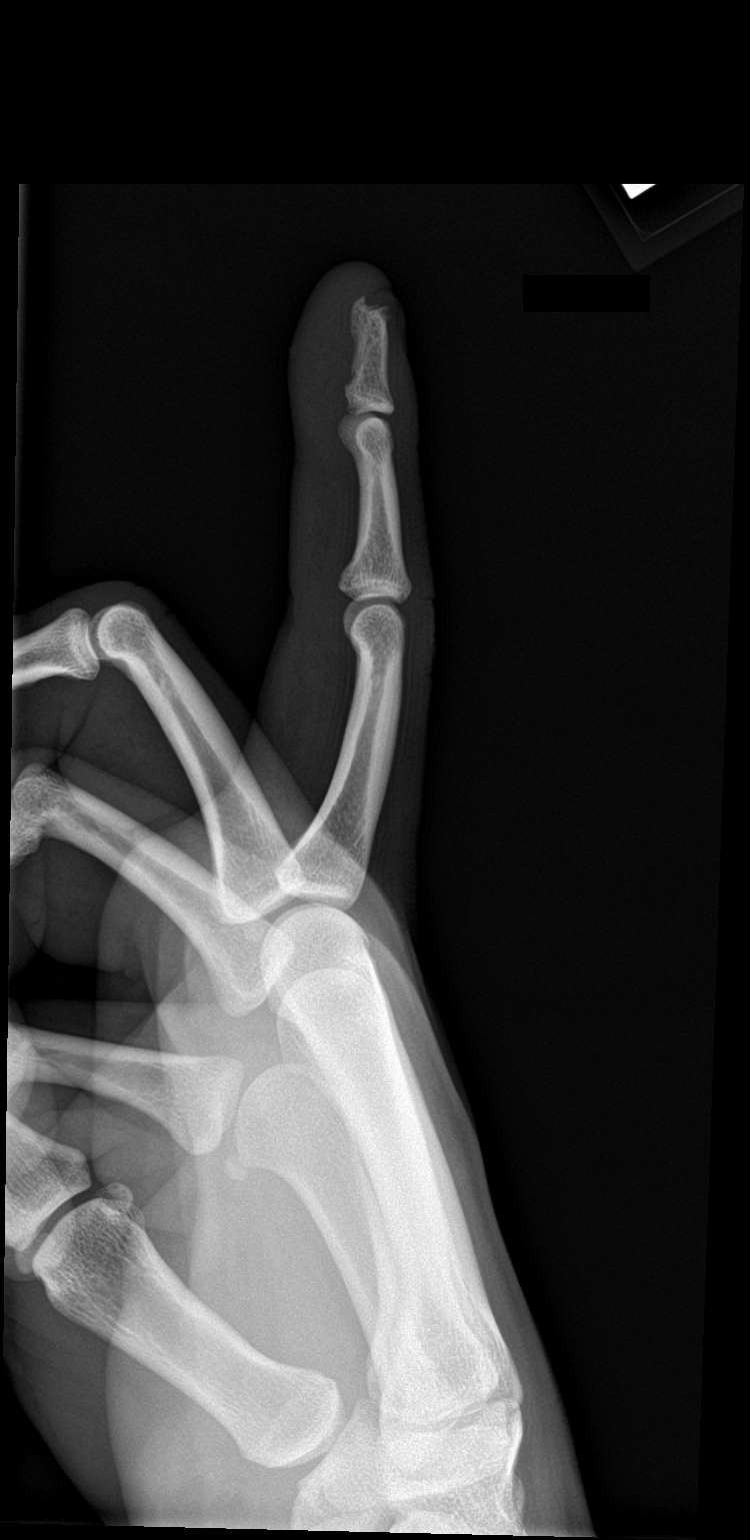

[3 of 3 positions shown; findings below may reference images not displayed]

FINDINGS: Frontal, oblique, and lateral views were obtained. There is evidence
of an old appearing injury to the distal aspect of the second distal
phalanx with remodeling. There is no appreciable acute fracture or
dislocation. Joint spaces appear normal. No radiopaque foreign body.
IMPRESSION: Evidence of old injury to the distal aspect of the second distal
phalanx. No acute fracture or dislocation. No radiopaque foreign
body. No apparent arthropathy.

## 2018-11-02 DIAGNOSIS — Z113 Encounter for screening for infections with a predominantly sexual mode of transmission: Secondary | ICD-10-CM | POA: Diagnosis not present

## 2018-11-02 DIAGNOSIS — Z114 Encounter for screening for human immunodeficiency virus [HIV]: Secondary | ICD-10-CM | POA: Diagnosis not present

## 2018-11-17 ENCOUNTER — Ambulatory Visit (INDEPENDENT_AMBULATORY_CARE_PROVIDER_SITE_OTHER): Payer: Self-pay | Admitting: Physician Assistant

## 2018-11-17 ENCOUNTER — Encounter: Payer: Self-pay | Admitting: Physician Assistant

## 2018-11-17 VITALS — BP 110/68 | HR 60 | Temp 98.0°F | Resp 16

## 2018-11-17 DIAGNOSIS — J069 Acute upper respiratory infection, unspecified: Secondary | ICD-10-CM

## 2018-11-17 DIAGNOSIS — K1370 Unspecified lesions of oral mucosa: Secondary | ICD-10-CM | POA: Diagnosis not present

## 2018-11-17 LAB — POCT RAPID STREP A (OFFICE): RAPID STREP A SCREEN: NEGATIVE

## 2018-11-17 MED ORDER — FLUTICASONE PROPIONATE 50 MCG/ACT NA SUSP: 2.0000 | Freq: Every day | NASAL | 0 refills | Status: DC

## 2018-11-17 MED ORDER — MAGIC MOUTHWASH W/LIDOCAINE: 5.0000 mL | ORAL | 0 refills | Status: DC | PRN

## 2018-11-17 MED ORDER — PSEUDOEPH-BROMPHEN-DM 30-2-10 MG/5ML PO SYRP: 5.0000 mL | ORAL_SOLUTION | Freq: Four times a day (QID) | ORAL | 0 refills | Status: DC | PRN

## 2018-11-17 MED ORDER — SALINE SPRAY 0.65 % NA SOLN: 1.0000 | NASAL | 0 refills | Status: DC | PRN

## 2018-11-17 NOTE — Patient Instructions (Signed)
Viral Respiratory Infection  Use Bromfed, Flonase, and nasal saline for congestion and cough. Use ibuprofen 600 mg every 8 hours as needed for pain.  Drink warm tea with honey lemon and ginger. Use mouthwash with lidocaine for pain. I recommend going to [place] [facility] or other local urgent care or primary care office to have testing for HSV since you are wanting a proper diagnosis. If any of your symptoms worsen or you develop any new concerning symptoms, please seek care immediately.    A viral respiratory infection is an illness that affects parts of the body that are used for breathing. These include the lungs, nose, and throat. It is caused by a germ called a virus. Some examples of this kind of infection are:  A cold.  The flu (influenza).  A respiratory syncytial virus (RSV) infection. A person who gets this illness may have the following symptoms:  A stuffy or runny nose.  Yellow or green fluid in the nose.  A cough.  Sneezing.  Tiredness (fatigue).  Achy muscles.  A sore throat.  Sweating or chills.  A fever.  A headache. Follow these instructions at home: Managing pain and congestion  Take over-the-counter and prescription medicines only as told by your doctor.  If you have a sore throat, gargle with salt water. Do this 3-4 times per day or as needed. To make a salt-water mixture, dissolve -1 tsp of salt in 1 cup of warm water. Make sure that all the salt dissolves.  Use nose drops made from salt water. This helps with stuffiness (congestion). It also helps soften the skin around your nose.  Drink enough fluid to keep your pee (urine) pale yellow. General instructions   Rest as much as possible.  Do not drink alcohol.  Do not use any products that have nicotine or tobacco, such as cigarettes and e-cigarettes. If you need help quitting, ask your doctor.  Keep all follow-up visits as told by your doctor. This is important. How is this  prevented?   Get a flu shot every year. Ask your doctor when you should get your flu shot.  Do not let other people get your germs. If you are sick: ? Stay home from work or school. ? Wash your hands with soap and water often. Wash your hands after you cough or sneeze. If soap and water are not available, use hand sanitizer.  Avoid contact with people who are sick during cold and flu season. This is in fall and winter. Get help if:  Your symptoms last for 10 days or longer.  Your symptoms get worse over time.  You have a fever.  You have very bad pain in your face or forehead.  Parts of your jaw or neck become very swollen. Get help right away if:  You feel pain or pressure in your chest.  You have shortness of breath.  You faint or feel like you will faint.  You keep throwing up (vomiting).  You feel confused. Summary  A viral respiratory infection is an illness that affects parts of the body that are used for breathing.  Examples of this illness include a cold, the flu, and respiratory syncytial virus (RSV) infection.  The infection can cause a runny nose, cough, sneezing, sore throat, and fever.  Follow what your doctor tells you about taking medicines, drinking lots of fluid, washing your hands, resting at home, and avoiding people who are sick. This information is not intended to replace advice given to  you by your health care provider. Make sure you discuss any questions you have with your health care provider. Document Released: 08/14/2008 Document Revised: 10/12/2017 Document Reviewed: 10/12/2017 Elsevier Interactive Patient Education  2019 [employer].

## 2018-11-17 NOTE — Progress Notes (Signed)
MRN: [id number] DOB: [birth year]  Subjective:   Preston Nelson is a 23 y.o. male presenting for chief complaint of Sore Throat (3-4 DAYS, WHITE SPOTS ) and CANKER SORE (3-4 DAYS ) .  Has multiple complaints.   1) Nasal congestion, sore throat, dry cough, and runny nose x 4 days. Denies fever, chills, sinus pain, inability to swallow, voice change, productive cough, wheezing, shortness of breath, chest tightness, chest pain and myalgia, nausea, vomiting, abdominal pain and diarrhea. Has tried some OTC cold and flu meds with relief. No known sick contact exposure.Has history of mono.  Denies history of seasonal allergies, asthma diabetes, heart disease, and autoimmune disease. Patient has not had flu shot this season.  Denies smoking.  PSH of tonsillectomy. Wants to be tested for strep.   2) Concerned about possible HSV.  Notes his girlfriend recently told her last week that she had HSV 1 of genitals.  He is unsure how long she has had.  He has performed oral sex on her in the past but not when she had any active lesions.  He himself has never had an outbreak on his lips or genitals.  He does have a history of canker sores in the mouth.  Notes they typically start with a little white dot and progress to shallow ulcer.  He will get them every few weeks.  This is been happening for the past few years.  Has been evaluated by primary care and dentist for this.  They have been followed for HSV in the past that have been negative.  They think it is due to his dry mouth.  He is currently taking a multivitamin daily.  Once he got sick with upper respiratory symptoms he got a few canker sores in his mouth.  Then woke up this morning and felt like his lower lips cracked.  Noticed a few white dots.  They are not painful.  Denies burning, tingling, itching, erythema, and warmth.  Denies lip trauma.  He went to [facility] last week and wanted to get tested for all STDs including HSV.  They performed blood work but  told him he did not need to be tested for HSV since you had never had an outbreak.  He is wanting a culture of the lesion on his lip and blood work.   No other questions or concerns.  Review of Systems  Constitutional: Negative for fever and malaise/fatigue.  Respiratory: Negative for hemoptysis.   Cardiovascular: Negative for palpitations.  Gastrointestinal: Negative for abdominal pain and vomiting.  Musculoskeletal: Negative for neck pain.  Neurological: Negative for headaches.    [place] has a current medication list which includes the following prescription(s): acyclovir, brompheniramine-pseudoephedrine-dm, fluticasone, hydrocodone-acetaminophen, lisdexamfetamine, magic mouthwash w/lidocaine, sodium chloride, and sulfamethoxazole-trimethoprim. Also has No Known Allergies.  Calian  has no past medical history on file. Also  has a past surgical history that includes Tonsillectomy (2011) and Wisdom tooth extraction (2012).   Objective:   Vitals: BP 110/68 (BP Location: Right Arm, Patient Position: Sitting, Cuff Size: Normal)   Pulse 60   Temp 98 F (36.7 C) (Oral)   Resp 16   SpO2 98%   Physical Exam Vitals signs reviewed.  Constitutional:      General: He is not in acute distress.    Appearance: He is well-developed. He is not ill-appearing or toxic-appearing.  HENT:     Head: Normocephalic and atraumatic.     Right Ear: Tympanic membrane, ear canal and external ear normal.  Left Ear: Tympanic membrane, ear canal and external ear normal.     Nose: Mucosal edema and congestion present.     Right Sinus: No maxillary sinus tenderness or frontal sinus tenderness.     Left Sinus: No maxillary sinus tenderness or frontal sinus tenderness.     Mouth/Throat:     Lips: Pink.     Mouth: Mucous membranes are moist.     Pharynx: Uvula midline. Posterior oropharyngeal erythema (posterior oropharynx with cobblestoning, no ulcerations or exudates) present. No pharyngeal swelling.      Tonsils: No tonsillar exudate or tonsillar abscesses. Swelling: 0 on the right. 0 on the left.      Comments: 3 pinpoint vesicular lesions noted on right lateral aspect of lower lip. These lesions are along the margin of outer lip and oral mucosa. No erythema or warmth. No crusting or purulent drainage. No TTP.    Eyes:     Conjunctiva/sclera: Conjunctivae normal.  Neck:     Musculoskeletal: Normal range of motion.  Cardiovascular:     Rate and Rhythm: Normal rate and regular rhythm.     Heart sounds: Normal heart sounds.  Pulmonary:     Effort: Pulmonary effort is normal.     Breath sounds: Normal breath sounds. No decreased breath sounds, wheezing, rhonchi or rales.  Lymphadenopathy:     Head:     Right side of head: No submental, submandibular, tonsillar, preauricular, posterior auricular or occipital adenopathy.     Left side of head: No submental, submandibular, tonsillar, preauricular, posterior auricular or occipital adenopathy.     Cervical: No cervical adenopathy.     Upper Body:     Right upper body: No supraclavicular adenopathy.     Left upper body: No supraclavicular adenopathy.  Skin:    General: Skin is warm and dry.  Neurological:     Mental Status: He is alert.     Results for orders placed or performed in visit on 11/17/18 (from the past 24 hour(s))  POCT rapid strep A     Status: Normal   Collection Time: 11/17/18  1:59 PM  Result Value Ref Range   Rapid Strep A Screen Negative Negative      Assessment and Plan :  1. Viral URI Patient is overall well-appearing, no acute distress.  VSS. History and physical exam are consistent with viral URI.  POC strep negative- pt reassured. Given educational material on viral URI.  Recommend symptomatic treatment at this time.  Advised to follow-up with family doctor, or local urgent care if no improvement in symptoms after 7 to 10 days.  Seek care sooner at local urgent care or ED if symptoms worsen/develop new  concerning symptoms.  Patient voices understanding. - brompheniramine-pseudoephedrine-DM 30-2-10 MG/5ML syrup; Take 5 mLs by mouth 4 (four) times daily as needed.  Dispense: 120 mL; Refill: 0 - fluticasone (FLONASE) 50 MCG/ACT nasal spray; Place 2 sprays into both nostrils daily.  Dispense: 16 g; Refill: 0 - sodium chloride (OCEAN) 0.65 % SOLN nasal spray; Place 1 spray into both nostrils as needed.  Dispense: 60 mL; Refill: 0  2. Sore throat - POCT rapid strep A  3. Canker sore Pt does have canker sore on soft palate.  Apparently these are common for him.  Has had them worked up by primary care and dentist in the past, negative for HSV per patient.  Recommend symptomatic treatment for pain control this time.  Provided Rx for Magic mouthwash.  May use over-the-counter ibuprofen as  prescribed.  Salt water gargles.  Follow-up with family doctor if these persist for further evaluation management. - magic mouthwash w/lidocaine SOLN; Take 5 mLs by mouth every 2 (two) hours as needed for mouth pain.  Dispense: 360 mL; Refill: 0  4. Mouth lesion Patient does have questionable lower lip lesions. Girlfriend recently diagnosed with HSV 1 however he did not have direct exposure to active lesions that he is aware of. Not completely typical for HSV lesions, as she is not having pain, itching, tingling, or burning sensation.  No lymphadenopathy.  However, appearance is different from the canker sore on the soft palate.  Discussed limitations of our office in terms of diagnostic tests.  Discussed option of treating empirically for HSV considering history and following up with PCP.  Patient declines empiric treatment.  Patient would like to know an answer today.  He would prefer to have a culture and blood work.  Educated him that he will have to go to local urgent care, family doctor, or ED to obtain culture and blood work.  Patient voices understanding.   Benjiman Core, Cordelia Poche  [facility] 11/17/2018 4:23 PM

## 2018-11-18 ENCOUNTER — Telehealth: Payer: Self-pay | Admitting: Physician Assistant

## 2018-11-18 NOTE — Telephone Encounter (Signed)
Patient presented to clinic with request for work excuse note for yesterday (was seen at [facility] yesterday). Completed work note. Provided to patient. SFS PA-C.

## 2018-11-21 ENCOUNTER — Encounter: Payer: Self-pay | Admitting: Physician Assistant

## 2019-04-13 DIAGNOSIS — B9689 Other specified bacterial agents as the cause of diseases classified elsewhere: Secondary | ICD-10-CM | POA: Diagnosis not present

## 2019-04-13 DIAGNOSIS — Z03818 Encounter for observation for suspected exposure to other biological agents ruled out: Secondary | ICD-10-CM | POA: Diagnosis not present

## 2019-04-13 DIAGNOSIS — J019 Acute sinusitis, unspecified: Secondary | ICD-10-CM | POA: Diagnosis not present

## 2023-10-01 ENCOUNTER — Other Ambulatory Visit: Payer: Self-pay

## 2023-10-01 ENCOUNTER — Ambulatory Visit: Payer: Self-pay | Admitting: Physician Assistant

## 2023-10-01 ENCOUNTER — Encounter: Payer: Self-pay | Admitting: Physician Assistant

## 2023-10-01 ENCOUNTER — Ambulatory Visit: Payer: Self-pay

## 2023-10-01 VITALS — BP 133/81 | HR 46 | Temp 98.0°F | Resp 16 | Ht 74.0 in | Wt 230.0 lb

## 2023-10-01 DIAGNOSIS — Z021 Encounter for pre-employment examination: Secondary | ICD-10-CM

## 2023-10-01 LAB — POCT URINE DRUG SCREEN
Methylenedioxyamphetamine: NOT DETECTED
POC Amphetamine UR: NOT DETECTED
POC BENZODIAZEPINES UR: NOT DETECTED
POC Barbiturate UR: NOT DETECTED
POC Cocaine UR: NOT DETECTED
POC DRUG SCREEN OXIDANTS URINE: NORMAL
POC Ecstasy UR: NOT DETECTED
POC Marijuana UR: NOT DETECTED
POC Methadone UR: NOT DETECTED
POC Methamphetamine UR: NOT DETECTED
POC Opiate Ur: NOT DETECTED
POC Oxycodone UR: NOT DETECTED
POC PH URINE: NORMAL
POC PHENCYCLIDINE UR: NOT DETECTED
POC SPECIFIC GRAVITY URINE: NORMAL
POC TRICYCLICS UR: NOT DETECTED
URINE TEMPERATURE: 96 [degF] (ref 90.0–100.0)

## 2023-10-01 LAB — POCT URINALYSIS DIPSTICK
Bilirubin, UA: NEGATIVE
Blood, UA: NEGATIVE
Glucose, UA: NEGATIVE
Ketones, UA: NEGATIVE
Leukocytes, UA: NEGATIVE
Nitrite, UA: NEGATIVE
Protein, UA: NEGATIVE
Spec Grav, UA: 1.015 (ref 1.010–1.025)
Urobilinogen, UA: 0.2 U/dL
pH, UA: 7 (ref 5.0–8.0)

## 2023-10-01 NOTE — Progress Notes (Signed)
Pt completed all requirements for Pre-employment FF physical. CLEARED UDS. DENIES ANY ISSUES OR CONCERNS AT THIS TIME. Gretel Acre

## 2023-10-01 NOTE — Progress Notes (Signed)
Pt completed all requirements for pre-employment physical. Cleared pre-employment UDS.

## 2023-10-01 NOTE — Progress Notes (Signed)
[facility] [place] clinic ____________________________________________   None    (approximate)  I have reviewed the triage vital signs and the nursing notes.   HISTORY  Chief Complaint Employment Physical   HPI Preston Nelson is a 28 y.o. 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  Patient voiced no concerns or complaints.         History reviewed. No pertinent past medical history.  Patient Active Problem List   Diagnosis Date Noted   Sinus congestion 06/16/2017   ADD (attention deficit disorder) 05/04/2017   Annual physical exam 09/23/2016    Past Surgical History:  Procedure Laterality Date   TONSILLECTOMY  2011   WISDOM TOOTH EXTRACTION  2012    Prior to Admission medications   Medication Sig Start Date End Date Taking? Authorizing Provider  Multiple Vitamin (MULTI-VITAMIN) tablet Take 1 tablet by mouth daily.   Yes [provider]    Allergies Patient has no known allergies.  Family History  Problem Relation Age of Onset   Prostate cancer Maternal Uncle    Mental illness Paternal Uncle        bipolar   Kidney disease Maternal Grandfather    Alcohol abuse Paternal Grandmother     Social History Social History   Tobacco Use   Smoking status: Never   Smokeless tobacco: Never  Substance Use Topics   Alcohol use: Yes    Alcohol/week: 0.0 - 1.0 standard drinks of alcohol   Drug use: No    Review of Systems Constitutional: No fever/chills Eyes: No visual changes. ENT: No sore throat. Cardiovascular: Denies chest pain. Respiratory: Denies shortness of breath. Gastrointestinal: No abdominal pain.  No nausea, no vomiting.  No diarrhea.  No constipation. Genitourinary: Negative for dysuria. Musculoskeletal: Negative for back pain. Skin: Negative for rash. Neurological: Negative for headaches, focal weakness or  numbness.  ____________________________________________   PHYSICAL EXAM:  VITAL SIGNS: BP 133/81  Pulse Rate 46Pulse Rate. 46. Data is abnormal. Taken on 10/01/23 1:36 PM  Temp 98 F (36.7 C)  Temp Source Temporal  Weight 230 lb (104.3 kg)  Height 6\' 2"  (1.88 m)  Resp 16   BMI: 29.53 kg/m2  BSA: 2.33 m2   Constitutional: Alert and oriented. Well appearing and in no acute distress. Eyes: Conjunctivae are normal. PERRL. EOMI. Head: Atraumatic. Nose: No congestion/rhinnorhea. Mouth/Throat: Mucous membranes are moist.  Oropharynx non-erythematous. Neck: No stridor. No cervical spine tenderness to palpation. Hematological/Lymphatic/Immunilogical: No cervical lymphadenopathy. Cardiovascular: Normal rate, regular rhythm. Grossly normal heart sounds.  Good peripheral circulation. Respiratory: Normal respiratory effort.  No retractions. Lungs CTAB. Gastrointestinal: Soft and nontender. No distention. No abdominal bruits. No CVA tenderness. Genitourinary: Deferred Musculoskeletal: No lower extremity tenderness nor edema.  No joint effusions. Neurologic:  Normal speech and language. No gross focal neurologic deficits are appreciated. No gait instability. Skin:  Skin is warm, dry and intact. No rash noted. Psychiatric: Mood and affect are normal. Speech and behavior are normal.  ____________________________________________   LABS Pending ____________________________________________  EKG  Sinus bradycardia 54 bpm ____________________________________________    ____________________________________________   INITIAL IMPRESSION / ASSESSMENT AND PLAN / ED COURSE  As part of my medical decision making, I reviewed the following data within the electronic MEDICAL RECORD NUMBER  No acute findings on physical exam and EKG.  Labs pending.            ____________________________________________   FINAL CLINICAL IMPRESSION Well exam  ED Discharge Orders  None         Note:  This document was prepared using Dragon voice recognition software and may include unintentional dictation errors.

## 2023-10-04 LAB — CMP12+LP+TP+TSH+6AC+CBC/D/PLT
ALT: 20 [IU]/L (ref 0–44)
AST: 26 [IU]/L (ref 0–40)
Albumin: 4.8 g/dL (ref 4.3–5.2)
Alkaline Phosphatase: 51 [IU]/L (ref 44–121)
BUN/Creatinine Ratio: 15 (ref 9–20)
BUN: 17 mg/dL (ref 6–20)
Basophils Absolute: 0 10*3/uL (ref 0.0–0.2)
Basos: 1 %
Bilirubin Total: 0.4 mg/dL (ref 0.0–1.2)
Calcium: 9.3 mg/dL (ref 8.7–10.2)
Chloride: 103 mmol/L (ref 96–106)
Chol/HDL Ratio: 3.3 {ratio} (ref 0.0–5.0)
Cholesterol, Total: 161 mg/dL (ref 100–199)
Creatinine, Ser: 1.15 mg/dL (ref 0.76–1.27)
EOS (ABSOLUTE): 0.1 10*3/uL (ref 0.0–0.4)
Eos: 2 %
Estimated CHD Risk: <0.5 times avg. (ref 0.0–1.0)
Free Thyroxine Index: 1.7 (ref 1.2–4.9)
GGT: 13 [IU]/L (ref 0–65)
Globulin, Total: 1.9 g/dL (ref 1.5–4.5)
Glucose: 84 mg/dL (ref 70–99)
HDL: 49 mg/dL (ref >39)
Hematocrit: 44.3 % (ref 37.5–51.0)
Hemoglobin: 14.7 g/dL (ref 13.0–17.7)
Immature Grans (Abs): 0 10*3/uL (ref 0.0–0.1)
Immature Granulocytes: 0 %
Iron: 117 ug/dL (ref 38–169)
LDH: 158 [IU]/L (ref 121–224)
LDL Chol Calc (NIH): 96 mg/dL (ref 0–99)
Lymphocytes Absolute: 1.8 10*3/uL (ref 0.7–3.1)
Lymphs: 45 %
MCH: 29.6 pg (ref 26.6–33.0)
MCHC: 33.2 g/dL (ref 31.5–35.7)
MCV: 89 fL (ref 79–97)
Monocytes Absolute: 0.4 10*3/uL (ref 0.1–0.9)
Monocytes: 10 %
Neutrophils Absolute: 1.6 10*3/uL (ref 1.4–7.0)
Neutrophils: 42 %
Phosphorus: 4.1 mg/dL (ref 2.8–4.1)
Platelets: 157 10*3/uL (ref 150–450)
Potassium: 4 mmol/L (ref 3.5–5.2)
RBC: 4.96 x10E6/uL (ref 4.14–5.80)
RDW: 12.7 % (ref 11.6–15.4)
Sodium: 142 mmol/L (ref 134–144)
T3 Uptake Ratio: 26 % (ref 24–39)
T4, Total: 6.4 ug/dL (ref 4.5–12.0)
TSH: 2.69 u[IU]/mL (ref 0.450–4.500)
Total Protein: 6.7 g/dL (ref 6.0–8.5)
Triglycerides: 84 mg/dL (ref 0–149)
Uric Acid: 6 mg/dL (ref 3.8–8.4)
VLDL Cholesterol Cal: 16 mg/dL (ref 5–40)
WBC: 3.9 10*3/uL (ref 3.4–10.8)
eGFR: 88 mL/min/{1.73_m2} (ref >59)

## 2023-10-04 LAB — QUANTIFERON-TB GOLD PLUS
QuantiFERON Mitogen Value: >10 [IU]/mL
QuantiFERON Nil Value: 0.01 [IU]/mL
QuantiFERON TB1 Ag Value: 0.04 [IU]/mL
QuantiFERON TB2 Ag Value: 0.01 [IU]/mL
QuantiFERON-TB Gold Plus: NEGATIVE

## 2023-10-04 LAB — HEPATITIS B SURFACE ANTIBODY,QUALITATIVE: Hep B Surface Ab, Qual: REACTIVE

## 2023-10-06 ENCOUNTER — Encounter: Payer: Self-pay | Admitting: Physician Assistant

## 2024-03-14 ENCOUNTER — Ambulatory Visit: Payer: Self-pay | Admitting: Student

## 2024-03-14 VITALS — BP 120/74 | HR 62 | Temp 97.8°F | Resp 12 | Wt 242.0 lb

## 2024-03-14 DIAGNOSIS — R0981 Nasal congestion: Secondary | ICD-10-CM

## 2024-03-14 DIAGNOSIS — J029 Acute pharyngitis, unspecified: Secondary | ICD-10-CM

## 2024-03-14 LAB — POCT RAPID STREP A (OFFICE): Rapid Strep A Screen: NEGATIVE

## 2024-03-14 MED ORDER — AMOXICILLIN 875 MG PO TABS: 875.0000 mg | ORAL_TABLET | Freq: Two times a day (BID) | ORAL | 0 refills | Status: AC

## 2024-03-14 MED ORDER — PREDNISONE 10 MG PO TABS: 10.0000 mg | ORAL_TABLET | Freq: Every day | ORAL | 0 refills | Status: AC

## 2024-03-14 NOTE — Progress Notes (Signed)
 S/Sx x4 days: States I think I might have an ear infection or a sinus infection Left Ear Pain Sore throat - pain with swallowing; States throat was reddened & white spots on back of throat on the left side Nasal drainage - green mucous Pressure on right side - especially in the mornings Fluid in left ear - states got it out Friday  Denies headache, cough and fever  Taking OTC Sudafed with minimal relief & ibuprofen   States S/Sx not as bad as they were, but throat is still sore & he thinks he might have a sinus infection because he's prone to sinus infections.  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

## 2024-03-14 NOTE — Progress Notes (Signed)
   Acute Office Visit  Subjective:     Patient ID: Preston Nelson, male    DOB: Dec 20, 1995, 28 y.o.   MRN: [id number]  Chief Complaint  Patient presents with   Sore Throat   Ear Pain    Left    HPI Patient is in today for concerns of possible sinus infection.  States that [place] and had gotten water in his left ear.  He feels water had gotten stuck in his ear following that activity.  Since then he has been having sinus pressure and posterior left ear pain.  Also complains of a sore throat with redness.  Denies any fever or chills.  Denies any GI issues including nausea, vomiting, diarrhea.  States history of sinus infections which feels similar to current complaints. He has attempted management with Sudafed and Tylenol  ROS      Objective:    BP 120/74 (BP Location: Left Arm, Patient Position: Sitting, Cuff Size: Large)   Pulse 62   Temp 97.8 F (36.6 C) (Temporal)   Resp 12   Wt 242 lb (109.8 kg)   SpO2 98%   BMI 31.07 kg/m    Physical Exam Constitutional:      General: He is not in acute distress.    Appearance: He is not ill-appearing.  HENT:     Head: Normocephalic.     Right Ear: Tympanic membrane and ear canal normal. No drainage. No middle ear effusion. Tympanic membrane is not erythematous.     Left Ear: Tympanic membrane and ear canal normal. No drainage.  No middle ear effusion. Tympanic membrane is not erythematous.     Nose: Congestion present.     Mouth/Throat:     Mouth: Mucous membranes are dry.     Pharynx: Posterior oropharyngeal erythema present. No oropharyngeal exudate.   Eyes:     Conjunctiva/sclera: Conjunctivae normal.    Cardiovascular:     Rate and Rhythm: Normal rate.     Heart sounds: Normal heart sounds.  Pulmonary:     Effort: Pulmonary effort is normal. No respiratory distress.     Breath sounds: Normal breath sounds.   Musculoskeletal:     Cervical back: Normal range of motion.    Neurological:     Mental Status: He is alert.     No results found for any visits on 03/14/24.      Assessment & Plan:   Presents today for evaluation of possible sinus infection.  He does have a history of sinus infections in the past and states current complaints feel similar just did after having lake water trapped in his left ear.  Complains of greenish nasal discharge.  Denies any cough.  No obvious signs of ear infection.  He does have some erythema at the oropharynx without exudate.  No sinus tenderness. Negative strep test. Will proceed with amoxicillin and 3-day daily prednisone.  Advised to take Sudafed and Mucinex.  Recommend follow-up in 7 days if symptoms have not improved.  Problem List Items Addressed This Visit   None   No orders of the defined types were placed in this encounter.   No follow-ups on file.  Alan Showers, PA-C

## 2024-03-14 NOTE — Addendum Note (Signed)
 Addended by: ELUTERIO JENKINS HERO on: 03/14/2024 01:55 PM   Modules accepted: Orders
